# Patient Record
Sex: Female | Born: 1935 | Race: White | Hispanic: No | Marital: Married
Health system: Southern US, Community
[De-identification: ages and names within clinical notes are randomized; demographics above are authoritative.]

---

## 1999-04-29 ENCOUNTER — Other Ambulatory Visit: Admission: RE | Admit: 1999-04-29 | Discharge: 1999-04-29 | Payer: Self-pay | Admitting: *Deleted

## 1999-07-15 ENCOUNTER — Encounter (HOSPITAL_COMMUNITY): Admission: RE | Admit: 1999-07-15 | Discharge: 1999-10-13 | Payer: Self-pay | Admitting: Dentistry

## 1999-07-15 ENCOUNTER — Encounter: Admission: RE | Admit: 1999-07-15 | Discharge: 1999-10-13 | Payer: Self-pay | Admitting: Radiation Oncology

## 2001-03-18 ENCOUNTER — Encounter: Payer: Self-pay | Admitting: Ophthalmology

## 2001-03-19 ENCOUNTER — Inpatient Hospital Stay (HOSPITAL_COMMUNITY): Admission: RE | Admit: 2001-03-19 | Discharge: 2001-03-20 | Payer: Self-pay | Admitting: Cardiovascular Disease

## 2002-01-17 ENCOUNTER — Inpatient Hospital Stay (HOSPITAL_COMMUNITY): Admission: RE | Admit: 2002-01-17 | Discharge: 2002-01-20 | Payer: Self-pay | Admitting: Cardiovascular Disease

## 2002-01-18 ENCOUNTER — Encounter: Payer: Self-pay | Admitting: Cardiovascular Disease

## 2002-01-20 ENCOUNTER — Encounter: Payer: Self-pay | Admitting: Cardiovascular Disease

## 2003-07-25 ENCOUNTER — Encounter: Payer: Self-pay | Admitting: Cardiovascular Disease

## 2003-07-25 ENCOUNTER — Encounter: Payer: Self-pay | Admitting: Pulmonary Disease

## 2003-07-25 ENCOUNTER — Inpatient Hospital Stay (HOSPITAL_COMMUNITY): Admission: EM | Admit: 2003-07-25 | Discharge: 2003-08-02 | Payer: Self-pay | Admitting: Cardiovascular Disease

## 2003-07-27 ENCOUNTER — Encounter: Payer: Self-pay | Admitting: Cardiovascular Disease

## 2003-07-28 ENCOUNTER — Encounter: Payer: Self-pay | Admitting: Cardiovascular Disease

## 2003-07-29 ENCOUNTER — Encounter: Payer: Self-pay | Admitting: Pulmonary Disease

## 2003-08-02 ENCOUNTER — Inpatient Hospital Stay: Admission: RE | Admit: 2003-08-02 | Discharge: 2003-08-11 | Payer: Self-pay | Admitting: Internal Medicine

## 2003-08-02 ENCOUNTER — Encounter: Payer: Self-pay | Admitting: Cardiovascular Disease

## 2003-08-03 ENCOUNTER — Ambulatory Visit (HOSPITAL_COMMUNITY): Admission: RE | Admit: 2003-08-03 | Discharge: 2003-08-03 | Payer: Self-pay | Admitting: Cardiovascular Disease

## 2003-08-05 ENCOUNTER — Encounter: Payer: Self-pay | Admitting: Endocrinology

## 2004-04-05 ENCOUNTER — Inpatient Hospital Stay (HOSPITAL_COMMUNITY): Admission: EM | Admit: 2004-04-05 | Discharge: 2004-04-07 | Payer: Self-pay | Admitting: Emergency Medicine

## 2004-09-16 ENCOUNTER — Ambulatory Visit: Payer: Self-pay | Admitting: Oncology

## 2005-04-02 ENCOUNTER — Ambulatory Visit: Payer: Self-pay | Admitting: Oncology

## 2005-06-11 IMAGING — CT CT HEAD W/O CM
1 of 2 series · 13 of 30 positions shown, 17 images · non-contrast
Comparison: the prior CT from [facility] is unavailable.

CLINICAL DATA: Intracranial hemorrhage.  
 CT HEAD, WITHOUT CONTRAST ? 04/06/04 (500 HOURS)

[Series 2: brain · axial · 0.47mm/px · z∈[+98,+218]mm · 13 of 28 slices shown, 17 images]
[im 2/28  brain]
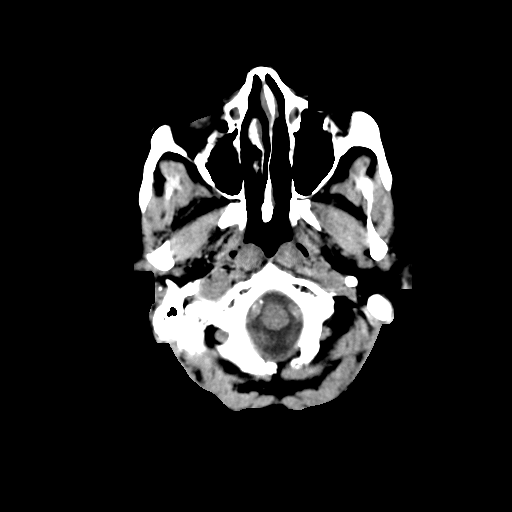
[im 2/28  bone]
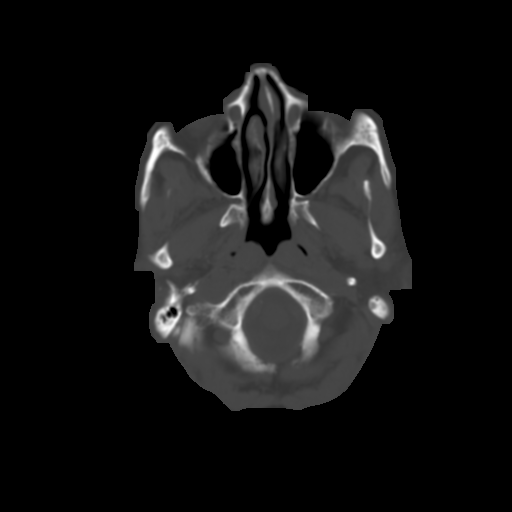
[im 4/28  brain]
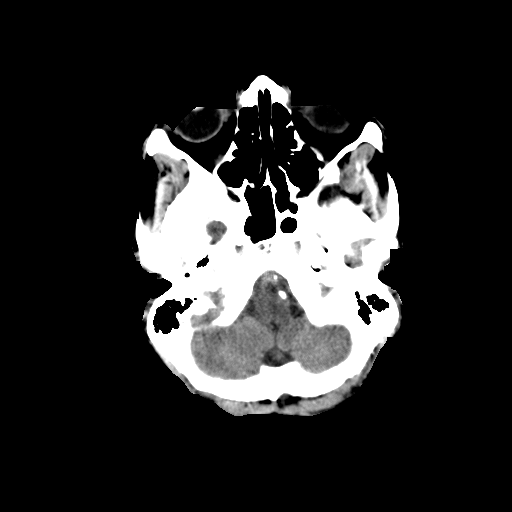
[im 6/28  brain]
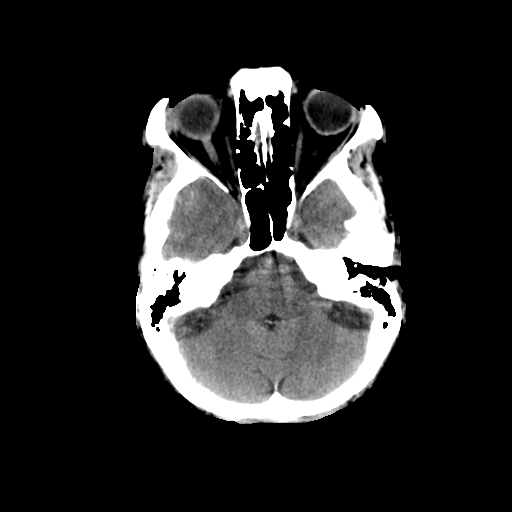
[im 8/28  brain]
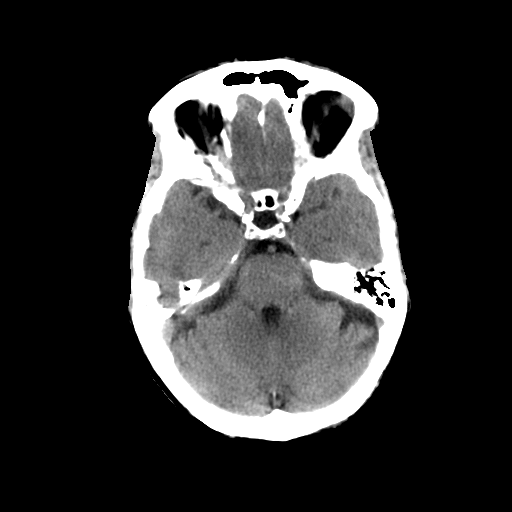
[im 10/28  brain]
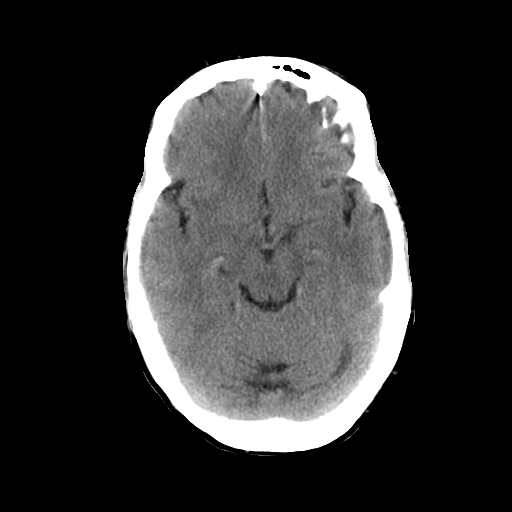
[im 10/28  bone]
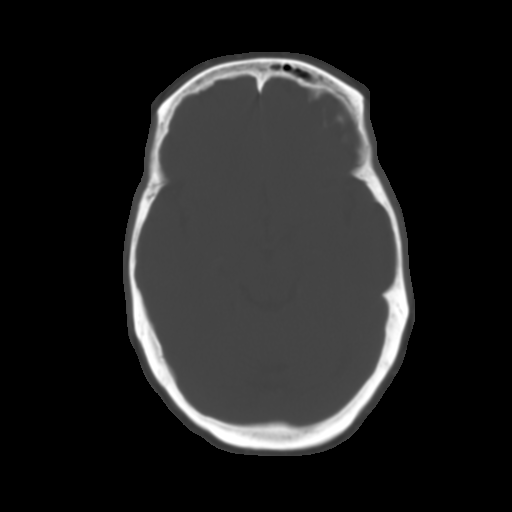
[im 12/28  brain]
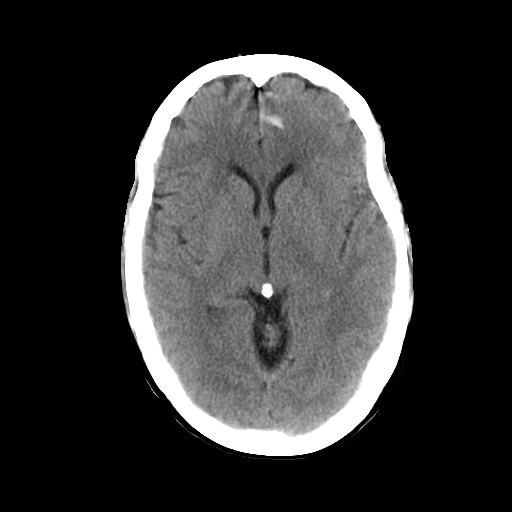
[im 14/28  brain]
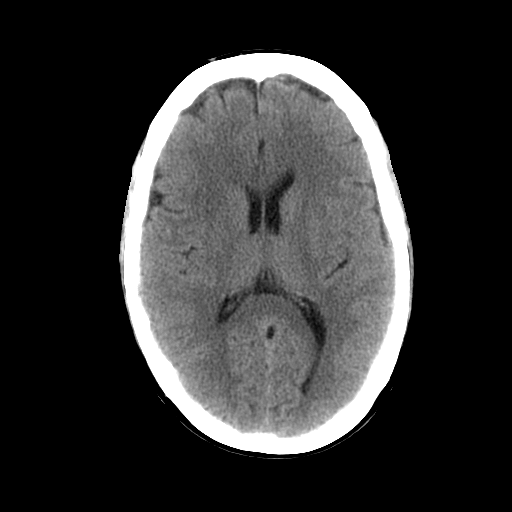
[im 16/28  brain]
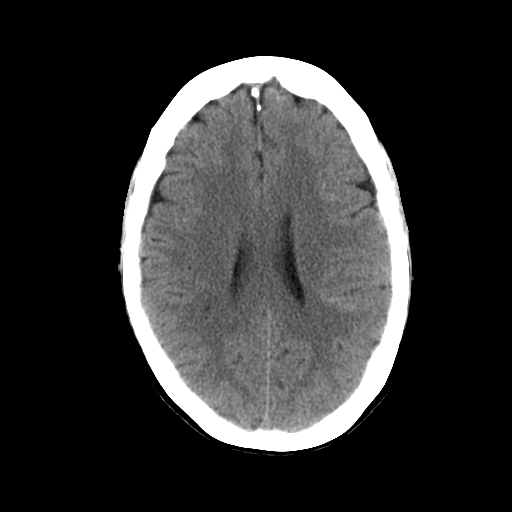
[im 18/28  brain]
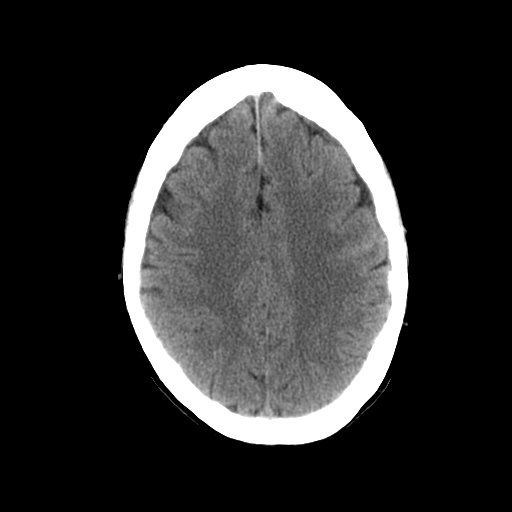
[im 18/28  bone]
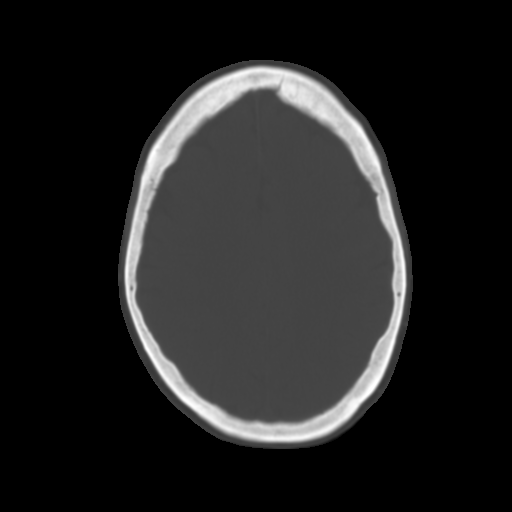
[im 20/28  brain]
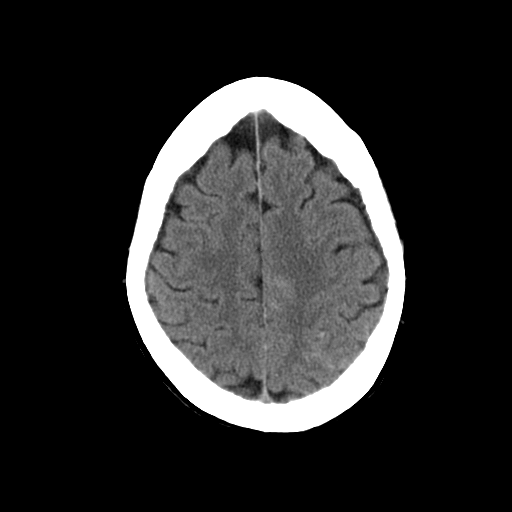
[im 22/28  brain]
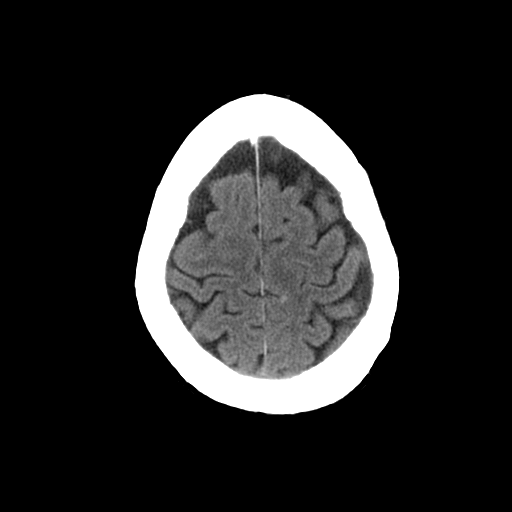
[im 24/28  brain]
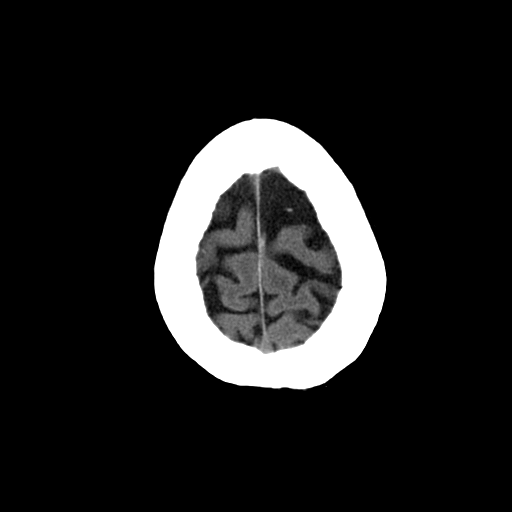
[im 26/28  brain]
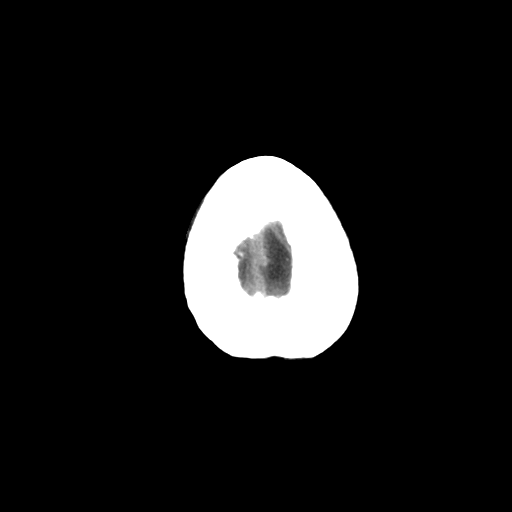
[im 26/28  bone]
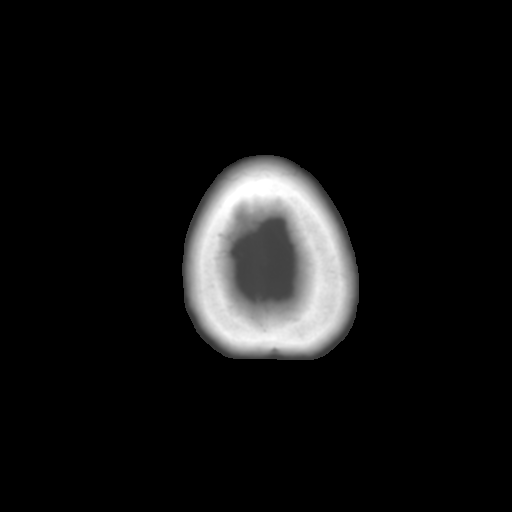

[13 of 30 positions shown; findings below may reference images not displayed]

FINDINGS: Subarachnoid hemorrhage is present in the medial left frontal lobe region.  Subarachnoid hemorrhage is also noted in the medial high left parietal lobe.  No underlying parenchymal contusion is seen there is no evidence of mass effect or midline shift. The ventricular system is within normal limits. The mastoid air cells and paranasal sinuses are clear.  The cranium is intact.  
 IMPRESSION 
 Subarachnoid hemorrhage overlying the left frontal and parietal lobes.

## 2006-05-08 ENCOUNTER — Ambulatory Visit: Payer: Self-pay | Admitting: Oncology

## 2007-05-24 ENCOUNTER — Ambulatory Visit: Payer: Self-pay | Admitting: Oncology

## 2007-12-31 ENCOUNTER — Inpatient Hospital Stay (HOSPITAL_COMMUNITY): Admission: EM | Admit: 2007-12-31 | Discharge: 2008-01-01 | Payer: Self-pay | Admitting: Emergency Medicine

## 2008-11-29 ENCOUNTER — Observation Stay (HOSPITAL_COMMUNITY): Admission: AD | Admit: 2008-11-29 | Discharge: 2008-11-30 | Payer: Self-pay | Admitting: Cardiovascular Disease

## 2010-02-04 IMAGING — CR DG CHEST 2V
2 series · 2 of 2 positions shown · non-contrast
Comparison: 12/31/2007.

CLINICAL DATA: Status post battery pack change, pacemaker change.

CHEST - 2 VIEW

[w chest pa]
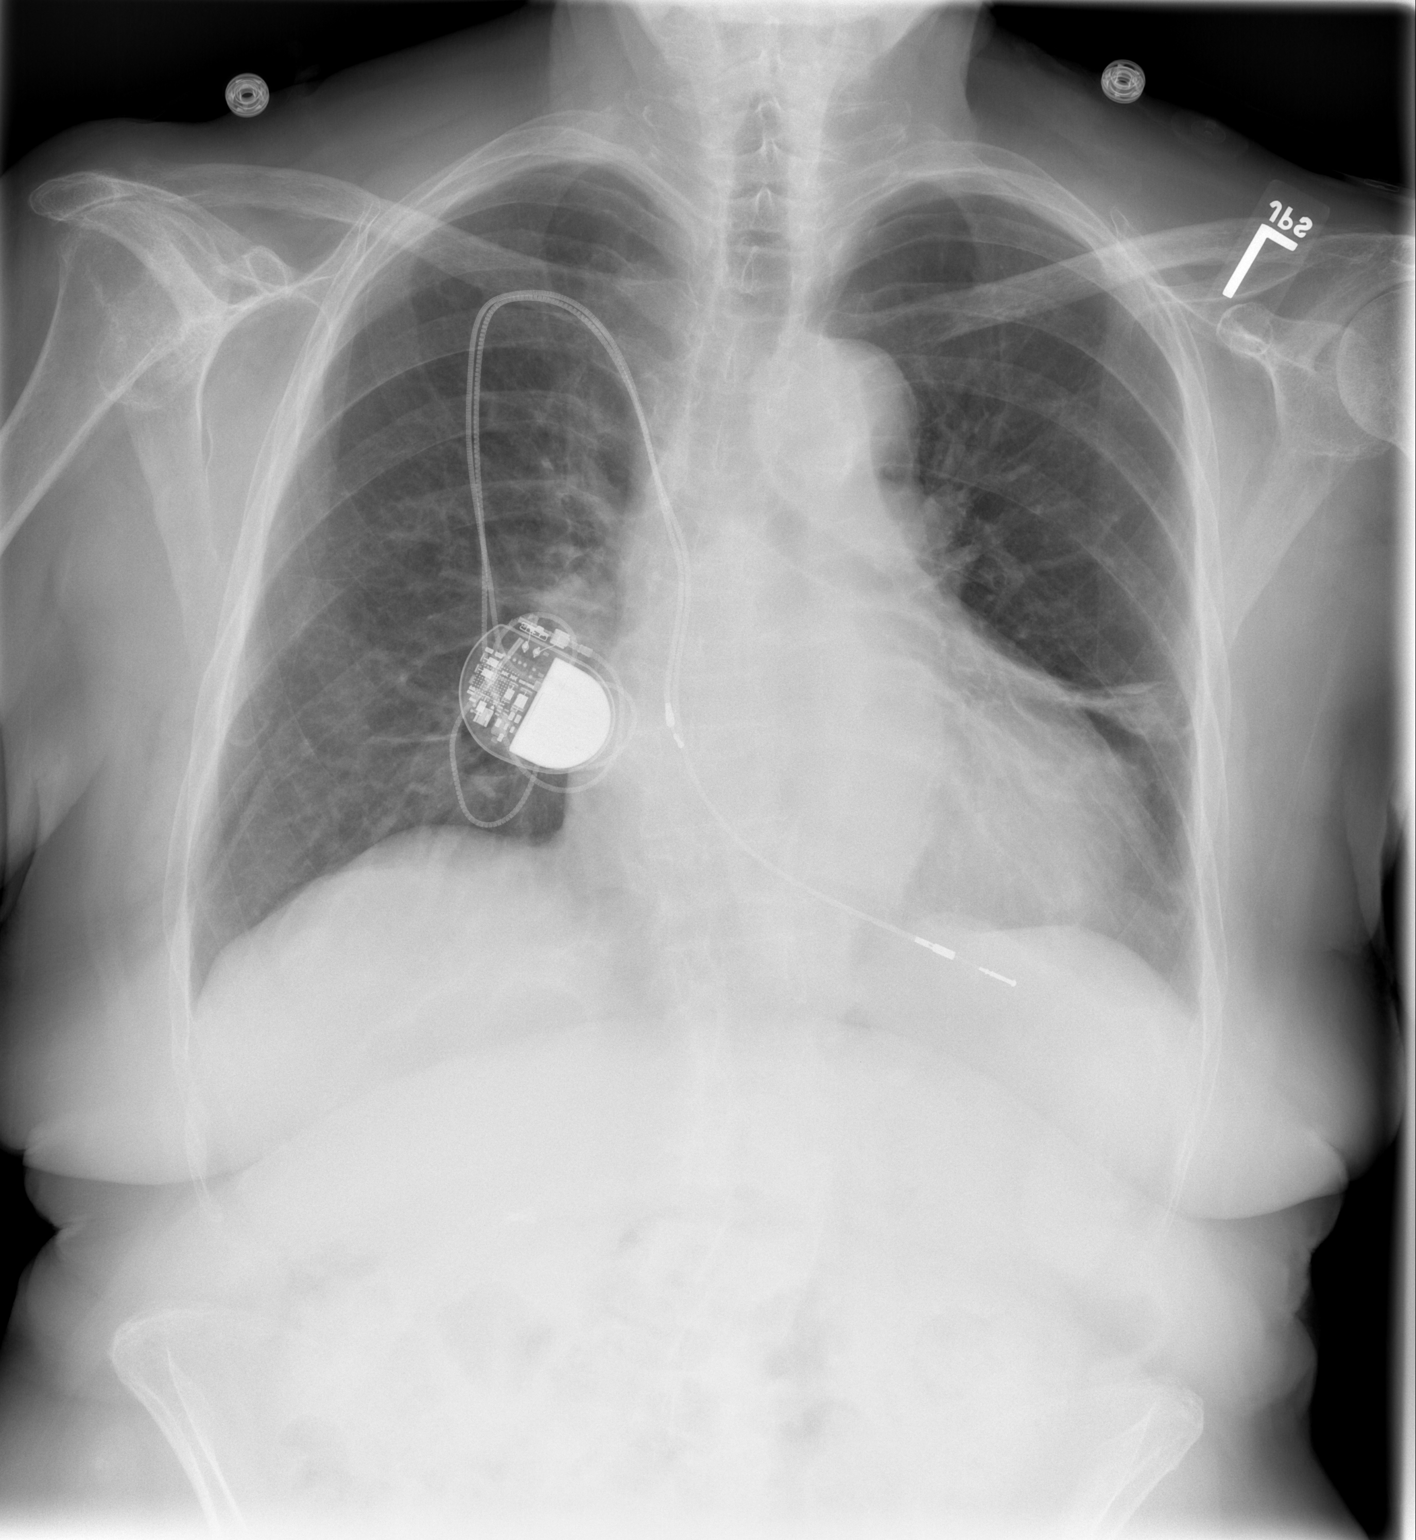

[w chest lat]
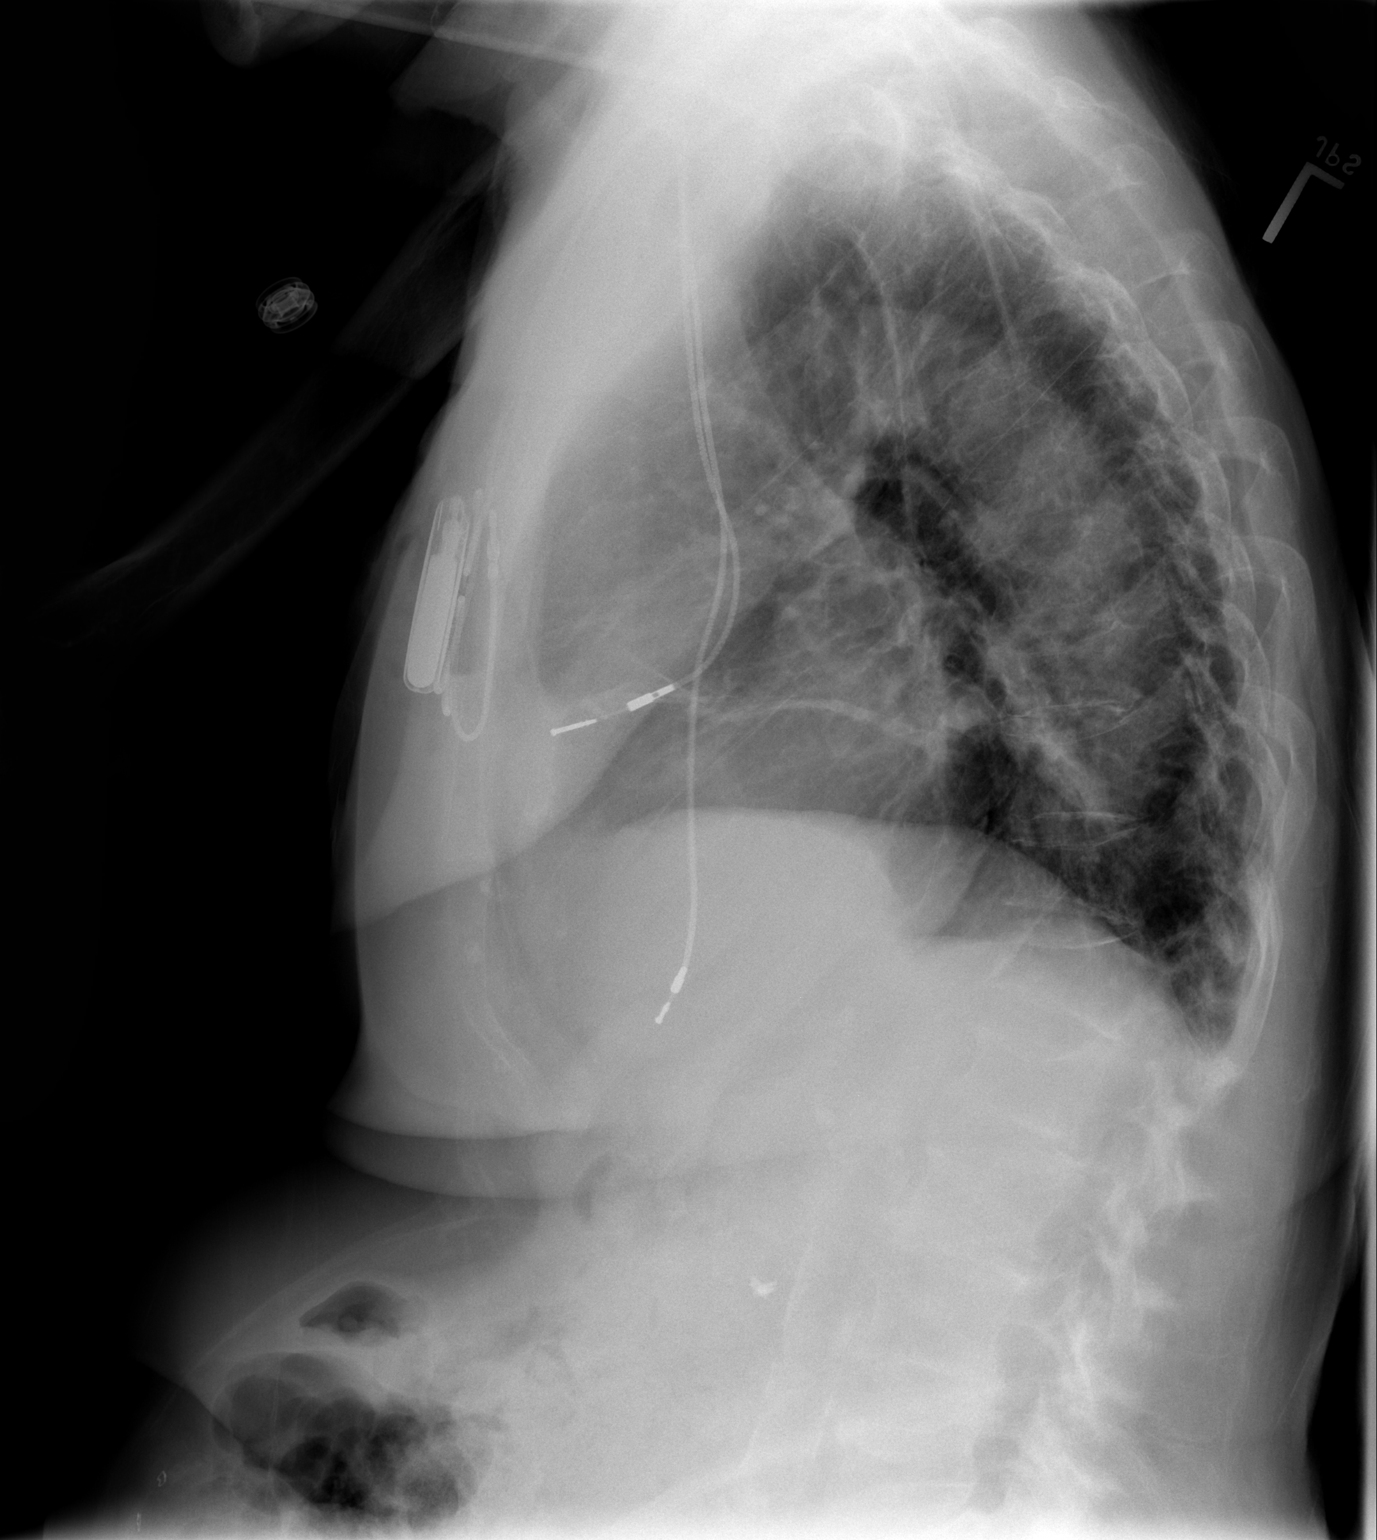

[2 of 2 positions shown; findings below may reference images not displayed]

FINDINGS: The pacemaker power pack has been modified.  One of the
leads appears disconnected, posterior to the circuitry of the power
pack.  Right atrial appendage and right ventricular apex leads
unchanged.  Interval development of subsegmental atelectasis in the
lingula.  No pleural effusion.  Cardiopericardial silhouette upper
limits of normal for projection.
IMPRESSION: 1.  Pacemaker power pack change as described above.  Leads
unchanged.
2.  Lingular subsegmental atelectasis.

## 2010-05-20 DEATH — deceased

## 2011-03-04 NOTE — Discharge Summary (Signed)
NAME:  Elaine Shelton, Elaine Shelton                    ACCOUNT NO.:  [id number]   MEDICAL RECORD NO.:  [id number]          PATIENT TYPE:  OBV   LOCATION:  3705                         FACILITY:  [facility]   PHYSICIAN:  Richard A. Alanda Amass, M.D.DATE OF BIRTH:  1925-11-12   DATE OF ADMISSION:  11/29/2008  DATE OF DISCHARGE:  11/30/2008                               DISCHARGE SUMMARY   DISCHARGE DIAGNOSES:  1. Elective generator change this admission secondary to end of life.  2. Chronic atrial fibrillation with sick sinus syndrome status post      Medtronic pacemaker implant in 2003 initially.  3. Coronary disease with non-drug-eluting stent placement to the right      coronary artery in February 2002 with a negative Myoview in      February 2008.  4. Preserved left ventricular function with an ejection fraction of 45-      55% by echocardiogram in May 2009.  5. Coumadin therapy.  6. History of lymphoma followed by Dr. Gilman Buttner in [place].  7. Treated hypertension.  8. Treated dyslipidemia.  9. Degenerative joint disease.  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  11.Past history of amiodarone-induced hepatitis.   HOSPITAL COURSE:  [name] is a pleasant 77 year old female who was been  followed by Dr. Alanda Amass and Dr. Carolyne Fiscal.  She has coronary disease as  noted above.  Recently, she was seen in the office and pacer check  indicated she was near end of life.  She was admitted for elective  implant of a VVI generator.  This was done on November 29, 2008, by Dr.  Lynnea Ferrier.  Her Coumadin was held prior to admission and she was not  bridged.  On November 29, 2008, her INR was 1.7.  The patient tolerated  pacer implant well.  She was seen on November 30, 2008, and felt to be  stable for discharge.  We will plan on keeping her INR 2-3.  She will  follow up with Dr. Alanda Amass in a week.   DISCHARGE MEDICATIONS:  1. Aspirin 81 mg a day.  2. Lipitor 20 mg nightly.  3. Coumadin 2.5 mg a day will be resumed on December 02, 2008.  4. Atenolol 100 mg in the morning and 50 mg in the evening.  5. Diltiazem 120 mg a day.  6. Remeron 15 mg h.s.  7. Namenda 10 mg b.i.d.  8. Risperidone 0.25 mg to 0.5 mg nightly.   There are no post implant labs, preop labs show a white count of 5.5,  hemoglobin 13.6, hematocrit 39.3, platelets 165.  INR 1.74, BUN 12,  creatinine 0.4.  Sodium 147, potassium 4.7.  LFTs were normal.  Cholesterol 178, HDL 51, LDL 101.  EKG is paced.   DISPOSITION:  The patient is discharged in stable condition.  She will  follow up with Dr. Alanda Amass in a week.  She will resume her Coumadin on  December 02, 2008.      Abelino Derrick, P.A.      Richard A. Alanda Amass,  M.D.  Electronically Signed    LKK/MEDQ  D:  11/30/2008  T:  11/30/2008  Job:  [id number]   cc:   Raynelle Jan, M.D.  Dellia Beckwith, M.D.
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
## 2011-03-07 NOTE — Op Note (Signed)
[place]. [facility]  Patient:    AMIYAH, SHRYOCK                          MRN: [id number] Proc. Date: 03/18/01 Adm. Date:  [id number] Attending:  Bertrum Sol                           Operative Report  DATE OF BIRTH:  07/28/1926.  ADMISSION DIAGNOSIS:  Macular hole, left eye.  PROCEDURE:  Pars plana vitrectomy left eye, retina photocoagulation left eye, membrane peel left eye, serum patch left eye, gas fluid injection left eye.  SURGEON:  Beulah Gandy. Ashley Royalty, M.D.  ASSISTANT:  Alma Downs, P.A.  ANESTHESIA:  General.  PROCEDURE IN DETAIL:  Usual prep and drape.  Periotomies at 10, 2 and 4 oclock.  The 4 mm angled infusion port placed at 4 oclock.  Contac lens anchored into place at 6 and 12 oclock.  Methylcellulose is placed on the cornea and the flat contact lens was placed.  The pars plana vitrectomy was begun just behind the crystalline lens where vitreous membranes were encountered.  These were carefully removed under low suction and rapid cutting down to the macular surface.  Once the core vitrectomy was complete, the silicone tipped extrusion line was drawn and moved into place and drawn down to the macular surface.  The fish strike sign occurred, and the posterior membranes were peeled with a lighted pick and the suction extrusion device. Once all of the posterior membranes were elevated from their attachments to the edge of the macular hole, they were removed with the vitreous cutter.  The peripheral retina was inspected and cleaned with the vitreous cutter down to the vitreous base.  All vitreous was removed.  The magnifying contact lens was then moved into place and the diamond-dusted membrane scraper was used to free up the edges of the macular hole and elevate the internal emitting membrane which was removed.  At this point, the indirect ophthalmoscopic laser was moved into place, and two rows of laser was placed around the  retina periphery, 421 burns, 400 milliwatts each, 1000 microns each, and .1 seconds each.  A gas-fluid exchange was carried out with suction of the optic nerve head.  Sufficient time was allowed for additional fluid to track down the walls of the eye.  The serum patch was prepared, and the gas mixture was prepared during this time.  Additional fluid was removed with the [place] Ophthalmic Brush.  Serum patch was delivered.  The intraoperative gas was exchanged for 17% perfluoroproprane.  The instruments were removed from the eye.  Nylon 9-0 was used to close the sclerotomy sites.  The conjunctiva was closed with wet-field cautery.  Polymyxin and gentamicin were irrigated into [place] space.  Atropine solution was applied.  Decadron 10 mg was injected into the lower subconjunctival space.  Marcaine was injected around the globe for postop pain.  The closing tension was 10 with the [place] tonometer. Polysporin, a patch and shield were placed.  The patient was awakened and taken to the recovery room in satisfactory condition. DD:  03/18/01 TD:  03/18/01 Job: [id number]

## 2011-03-07 NOTE — Cardiovascular Report (Signed)
[place]. [facility]  Patient:    OMER, MONTER Visit Number: [id number] MRN: [id number]          Service Type: MED Location: CCUB [id number] [id number] Attending Physician:  Ruta Hinds Dictated by:   Pearletha Furl Alanda Amass, M.D. Proc. Date: 01/19/02 Admit Date:  01/17/2002   CC:         Cardiopulmonary Lab  Y. Sandhu, M.D. [address], [place], [place]   Cardiac Catheterization  PROCEDURE:  Implantation of permanent DDDR rate-responsive A-V universal Medtronic Kappa KDR 901 pulse generator, with passive fixation Medtronic steroid elluding 45CM atrial and 52CM ventricular bipolar electrodes.  IMPLANTING PHYSICIAN:  Richard A. Alanda Amass, M.D.  COMPLICATIONS:  None.  ESTIMATED BLOOD LOSS:  Approximately 30 cc.  ANESTHESIA:  5 mg Valium p.o. premedication.  Intermittent Nubain 4 mg and Versed 2 mg IV during the procedure for sedation.  1% local Xylocaine.  PREOPERATIVE DIAGNOSES: 1. Sick sinus syndrome -- recurrent atrial fibrillation with rapid    ventricular response, symptomatic on medical therapy. 2. Symptomatic bradycardia, post conversion to sinus rhythm. 3. Atherosclerotic heart disease, status post right coronary stenting    on Mar 19, 2001; with moderate 60-70% circumflex, 40% LAD disease,    50% left renal artery stenosis.  Negative follow-up Cardiolite on    March 2003 at [facility]. 4. Systemic hypertension. 5. Remote lymphoma, treated with chemotherapy without recurrence. 6. Hyperlipidemia. 7. Exogenous obesity.  POSTOPERATIVE DIAGNOSES: 1. Sick sinus syndrome -- recurrent atrial fibrillation with rapid    ventricular response, symptomatic on medical therapy. 2. Symptomatic bradycardia, post conversion to sinus rhythm. 3. Atherosclerotic heart disease, status post right coronary stenting    on Mar 19, 2001, with moderate 60-70% circumflex, 40% LAD disease,    50% left renal artery stenosis.  Negative follow-up Cardiolite on  March 2003 at [facility]. 4. Systemic hypertension. 5. Remote lymphoma, treated with chemotherapy without recurrence. 6. Hyperlipidemia. 7. Exogenous obesity.  PULSE GENERATOR:  Medtronic [id number]; SN: [id number].  ATRIAL ELECTRODE:  Medtronic 4592; SN: [id number].  VENTRICULAR ELECTRODE:  Medtronic [id number]; SN: [id number].  MAGNET RATE:  BOL=85; ERA magnet rate drops to 65 per minute.  BIPOLAR THRESHOLDS: 1. Atrial:  P=0.5 mV; impedance 443 ohms, threshold 0.9 V. 2. Ventricular bipolar:  R = 5.3 mV, impedance 624 ohms, threshold 0.5 V. 3. Unipolar atrial:  P = 0.4 mV, impedance 400 ohms, threshold 0.5 V. 4. Unipolar ventricular: R = 6.7 mV, impedance 543 ohms, threshold 0.3 V.  There is a 1:1 retrograde V-A conduction at 110 per minute.  There was retrograde Wenckebach at 120 per minute.  Retrograde conduction was short at 220 msec.  There was no diaphragmatic pacing with testing through the PSA, on 10 V output atrial or ventricular unipolar or bipolar settings.  PROCEDURE:  The patient was brought to the second floor CP lab in the postabsorptive state, after 5 mg Valium p.o. premedication.  The right anterior chest was prepped and draped in the usual manner.  Xylocaine 1% was used for local anesthesia.  A right infraclavicular curvilinear transverse incision was performed, and brought down into the prepectoral fascia using blunt dissection; electrocautery used to control hemostasis.  Pulse generator pocket was formed in the prepectoral fascia area.  Several sticks were required to locate the subclavian vein because of her anatomy. This was done through the pocket and finally done with separate stab incision above the incision.  The vein was then easily located and a guide wire placed under  fluoroscopic control.  The guide wire was then tunneled subcutaneously back into the pocket without difficulty.  Tandem #9 Glendell Docker and Medtronic peel-away introducers were used for  introduction of the atrial and ventricular electrodes in tandem; with a retained guide wire that was removed after positioning and threshold testing.  Ventricular electrode was positioned in the RV apex.  The atrial electrode in the right atrial appendage, confirmed by fluoroscopy and rotational maneuvers.  Both electrodes were stable. Retrograde conduction study was then performed, as outlined above.  The electrodes were secured at the insertion site with a figure-of-eight #1 silk suture to prevent migration and control hemostasis; further secured with two interrupted #1 silk sutures for a silicone sew-in collar for each electrode. The pocket was irrigated with 500 mg kanamycin solution.  R waves were recorded, which showed a biphasic predominately negative R wave, with good current of entry and adequate P waves (albeit small).  The generator was hooked to the electrodes in the proper atrial-ventricular sequence, with single hex nut tightened for each electrode.  The generator was delivered into the pocket, with the electrodes looped behind and loosely secured to the underlying muscular fascia with #1 silk suture to prevent migration.  The sponge count was correct.  The subcutaneous tissue was closed in two separate running layers with 2-0 Dexon suture, and the skin was closed with 5-0 subcuticular Dexon suture.  Steri-Strips were applied over the stab wound and over the incision.  Fluoroscopy showed good position of the atrial and ventricular electrodes.  The patient tolerated the procedure well and was transferred to the holding area for postoperative programming and care; in stable condition.  Her Cardizem was discontinued at the start of the procedure, and she was given two IV boluses at the beginning and the end of the procedure of amiodarone 150 mg.  She will be started on p.o. amiodarone, along with beta blockers and other medications for her coronary disease and associated medical  problems.  Dictated by:   Pearletha Furl Alanda Amass, M.D. Attending Physician:  Ruta Hinds DD:  01/19/02 TD:  01/19/02 Job: [id number]

## 2011-07-14 LAB — CBC
HCT: 37.6
HCT: 39.8
Hemoglobin: 13.5
MCHC: 34
Platelets: 162
RDW: 14.1
WBC: 5.1

## 2011-07-14 LAB — PROTIME-INR
INR: 1
Prothrombin Time: 13

## 2011-07-14 LAB — DIFFERENTIAL
Basophils Absolute: 0
Basophils Relative: 0
Monocytes Absolute: 0.3
Neutro Abs: 3.3
Neutrophils Relative %: 70

## 2011-07-14 LAB — LIPID PANEL
HDL: 40
Triglycerides: 77

## 2011-07-14 LAB — DIGOXIN LEVEL: Digoxin Level: 0.4 — ABNORMAL LOW

## 2011-07-14 LAB — I-STAT 8, (EC8 V) (CONVERTED LAB)
BUN: 15
Bicarbonate: 25.1 — ABNORMAL HIGH
Hemoglobin: 13.6
Operator id: 270651
Potassium: 4.4
Sodium: 142
TCO2: 26

## 2011-07-14 LAB — CARDIAC PANEL(CRET KIN+CKTOT+MB+TROPI)
CK, MB: 1.5
Total CK: 42
Troponin I: 0.01
Troponin I: <0.01

## 2011-07-14 LAB — COMPREHENSIVE METABOLIC PANEL
Alkaline Phosphatase: 107
BUN: 12
Chloride: 109
Glucose, Bld: 94
Potassium: 4.4
Total Bilirubin: 1.3 — ABNORMAL HIGH

## 2011-07-14 LAB — B-NATRIURETIC PEPTIDE (CONVERTED LAB): Pro B Natriuretic peptide (BNP): 295 — ABNORMAL HIGH

## 2011-07-14 LAB — POCT CARDIAC MARKERS: CKMB, poc: <1 — ABNORMAL LOW

## 2013-08-25 ENCOUNTER — Telehealth: Payer: Self-pay | Admitting: Internal Medicine

## 2013-08-25 ENCOUNTER — Encounter: Payer: Self-pay | Admitting: Internal Medicine

## 2013-08-25 NOTE — Telephone Encounter (Signed)
08-25-13 called pt to set up fu for device with dr taylor, n/a, no voicemail, sent schedule letter/mt

## 2013-09-29 ENCOUNTER — Telehealth: Payer: Self-pay | Admitting: Internal Medicine

## 2013-09-29 NOTE — Telephone Encounter (Addendum)
08-25-13 n/a, sent letter to schedule appt for device fu with dr Ladona Ridgel , former [facility] pt/mt 09-29-13 called 326pm n/a /mt

## 2014-07-06 ENCOUNTER — Encounter: Payer: Self-pay | Admitting: Cardiovascular Disease

## 2014-07-06 ENCOUNTER — Telehealth: Payer: Self-pay | Admitting: Cardiovascular Disease

## 2014-07-06 NOTE — Telephone Encounter (Signed)
07-06-14 sent past due certified letter, [employer] pt/mt
